# Patient Record
Sex: Female | Born: 1995 | Race: White | Marital: Single | Smoking: Never smoker
Health system: Southern US, Community
[De-identification: ages and names within clinical notes are randomized; demographics above are authoritative.]

## PROBLEM LIST (undated history)

## (undated) DIAGNOSIS — Z973 Presence of spectacles and contact lenses: Secondary | ICD-10-CM

## (undated) DIAGNOSIS — F32A Depression, unspecified: Secondary | ICD-10-CM

## (undated) DIAGNOSIS — F329 Major depressive disorder, single episode, unspecified: Secondary | ICD-10-CM

## (undated) HISTORY — DX: Presence of spectacles and contact lenses: Z97.3

---
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
## 2016-04-15 ENCOUNTER — Other Ambulatory Visit: Payer: Self-pay | Admitting: Family Medicine

## 2016-04-15 ENCOUNTER — Ambulatory Visit
Admission: RE | Admit: 2016-04-15 | Discharge: 2016-04-15 | Disposition: A | Discharge status: Home / Self Care | Payer: 59 | Source: Ambulatory Visit | Attending: Family Medicine | Admitting: Family Medicine

## 2016-04-15 DIAGNOSIS — M419 Scoliosis, unspecified: Secondary | ICD-10-CM

## 2016-10-22 ENCOUNTER — Encounter (HOSPITAL_COMMUNITY): Payer: Self-pay

## 2016-10-22 ENCOUNTER — Emergency Department (HOSPITAL_COMMUNITY)
Admission: EM | Admit: 2016-10-22 | Discharge: 2016-10-23 | Disposition: A | Discharge status: Home / Self Care | Payer: BLUE CROSS/BLUE SHIELD | Attending: Emergency Medicine | Admitting: Emergency Medicine

## 2016-10-22 DIAGNOSIS — F32A Depression, unspecified: Secondary | ICD-10-CM

## 2016-10-22 DIAGNOSIS — Z79899 Other long term (current) drug therapy: Secondary | ICD-10-CM | POA: Diagnosis not present

## 2016-10-22 DIAGNOSIS — R45851 Suicidal ideations: Secondary | ICD-10-CM

## 2016-10-22 DIAGNOSIS — F325 Major depressive disorder, single episode, in full remission: Secondary | ICD-10-CM | POA: Diagnosis present

## 2016-10-22 DIAGNOSIS — F329 Major depressive disorder, single episode, unspecified: Secondary | ICD-10-CM | POA: Insufficient documentation

## 2016-10-22 HISTORY — DX: Depression, unspecified: F32.A

## 2016-10-22 HISTORY — DX: Major depressive disorder, single episode, unspecified: F32.9

## 2016-10-22 LAB — CBG MONITORING, ED: Glucose-Capillary: 77 mg/dL (ref 65–99)

## 2016-10-22 LAB — RAPID URINE DRUG SCREEN, HOSP PERFORMED
Amphetamines: NOT DETECTED
BARBITURATES: NOT DETECTED
BENZODIAZEPINES: NOT DETECTED
COCAINE: NOT DETECTED
Opiates: NOT DETECTED
TETRAHYDROCANNABINOL: NOT DETECTED

## 2016-10-22 LAB — CBC
HEMATOCRIT: 37.9 % (ref 36.0–46.0)
HEMOGLOBIN: 12.6 g/dL (ref 12.0–15.0)
MCH: 28.3 pg (ref 26.0–34.0)
MCHC: 33.2 g/dL (ref 30.0–36.0)
MCV: 85 fL (ref 78.0–100.0)
Platelets: 205 10*3/uL (ref 150–400)
RBC: 4.46 MIL/uL (ref 3.87–5.11)
RDW: 13.2 % (ref 11.5–15.5)
WBC: 6.3 10*3/uL (ref 4.0–10.5)

## 2016-10-22 LAB — ETHANOL: Alcohol, Ethyl (B): <5 mg/dL (ref <5)

## 2016-10-22 LAB — ACETAMINOPHEN LEVEL

## 2016-10-22 LAB — COMPREHENSIVE METABOLIC PANEL
ALBUMIN: 4.5 g/dL (ref 3.5–5.0)
ALK PHOS: 32 U/L — AB (ref 38–126)
ALT: 15 U/L (ref 14–54)
ANION GAP: 13 (ref 5–15)
AST: 21 U/L (ref 15–41)
BUN: 8 mg/dL (ref 6–20)
CALCIUM: 9.5 mg/dL (ref 8.9–10.3)
CO2: 22 mmol/L (ref 22–32)
Chloride: 102 mmol/L (ref 101–111)
Creatinine, Ser: 0.57 mg/dL (ref 0.44–1.00)
GFR calc Af Amer: >60 mL/min (ref >60)
GFR calc non Af Amer: >60 mL/min (ref >60)
GLUCOSE: 112 mg/dL — AB (ref 65–99)
Potassium: 3.5 mmol/L (ref 3.5–5.1)
SODIUM: 137 mmol/L (ref 135–145)
Total Bilirubin: 0.9 mg/dL (ref 0.3–1.2)
Total Protein: 7.3 g/dL (ref 6.5–8.1)

## 2016-10-22 LAB — I-STAT BETA HCG BLOOD, ED (MC, WL, AP ONLY): I-stat hCG, quantitative: <5 m[IU]/mL (ref <5)

## 2016-10-22 LAB — SALICYLATE LEVEL: Salicylate Lvl: <7 mg/dL (ref 2.8–30.0)

## 2016-10-22 NOTE — ED Notes (Signed)
Pt belongings sent home with mother. 

## 2016-10-22 NOTE — ED Triage Notes (Signed)
Pt endorses suicidal thoughts with plan to harm herself by overdosing on drugs(nothing specific). Pt has hx of same. Denies hallucinations or alcohol/drug use. VSS.

## 2016-10-22 NOTE — ED Provider Notes (Signed)
[facility]ENCY DEPT Provider Note   CSN: [id number] Arrival date & time: 10/22/16  4098     History   Chief Complaint Chief Complaint  Patient presents with  . Suicidal    HPI Alison Avery is a 21 y.o. female.  Patient with a history of depression presents with SI, progressively worse over the last couple of days. She reports that she weaned herself off medication last year and is not taking anything currently. No alcohol or substance abuse. She denies self harm prior to arrival. No HI, AVH.    The history is provided by the patient. No language interpreter was used.    Past Medical History:  Diagnosis Date  . Depression   . Wears glasses    for distant vision    Patient Active Problem List   Diagnosis Date Noted  . 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 No pertinent surgical history.  OB History    No data available       Home Medications    Prior to Admission medications   Medication Sig Start Date End Date Taking? Authorizing Provider  buPROPion (WELLBUTRIN XL) 300 MG 24 hr tablet TAKE 1 TABLET BY MOUTH EVERY MORNING 09/17/15   Nelly Rout, MD    Family History Family History  Problem Relation Age of Onset  . ADD / ADHD Neg Hx   . Alcohol abuse Neg Hx   . Anxiety disorder Neg Hx   . Bipolar disorder Neg Hx   . Dementia Neg Hx   . Depression Neg Hx   . Drug abuse Neg Hx   . OCD Neg Hx   . Paranoid behavior Neg Hx   . Physical abuse Neg Hx   . Schizophrenia Neg Hx   . Seizures Neg Hx   . Sexual abuse Neg Hx     Social History Social History  Substance Use Topics  . Smoking status: Never Smoker  . Smokeless tobacco: Never Used  . Alcohol use No     Allergies   Patient has no known allergies.   Review of Systems Review of Systems  Constitutional: Negative for chills and fever.  HENT: Negative.   Respiratory: Negative.   Cardiovascular: Negative.   Gastrointestinal:  Negative.   Musculoskeletal: Negative.   Skin: Negative.   Neurological: Negative.   Psychiatric/Behavioral: Positive for suicidal ideas. Negative for hallucinations and self-injury.     Physical Exam Updated Vital Signs BP 98/80 (BP Location: Left Arm)   Pulse 100   Temp 98.4 F (36.9 C) (Oral)   Resp 18   Ht 5\' 8"  (1.727 m)   Wt 54.4 kg   LMP 09/26/2016 (Exact Date)   SpO2 100%   BMI 18.25 kg/m   Physical Exam  Constitutional: She is oriented to person, place, and time. She appears well-developed and well-nourished.  HENT:  Head: Normocephalic.  Neck: Normal range of motion. Neck supple.  Cardiovascular: Normal rate and regular rhythm.   Pulmonary/Chest: Effort normal and breath sounds normal. She has no wheezes. She has no rales.  Abdominal: Soft. Bowel sounds are normal. There is no tenderness. There is no rebound and no guarding.  Musculoskeletal: Normal range of motion.  Neurological: She is alert and oriented to person, place, and time.  Skin: Skin is warm and dry. No rash noted.  Psychiatric: She has a normal mood and affect.     ED Treatments / Results  Labs (all labs ordered are listed,  but only abnormal results are displayed) Labs Reviewed  COMPREHENSIVE METABOLIC PANEL - Abnormal; Notable for the following:       Result Value   Glucose, Bld 112 (*)    Alkaline Phosphatase 32 (*)    All other components within normal limits  ACETAMINOPHEN LEVEL - Abnormal; Notable for the following:    Acetaminophen (Tylenol), Serum <10 (*)    All other components within normal limits  ETHANOL  SALICYLATE LEVEL  CBC  RAPID URINE DRUG SCREEN, HOSP PERFORMED  I-STAT BETA HCG BLOOD, ED ([facility], [facility], [facility] ONLY)  CBG MONITORING, ED   Results for orders placed or performed during the hospital encounter of 10/22/16  Comprehensive metabolic panel  Result Value Ref Range   Sodium 137 135 - 145 mmol/L   Potassium 3.5 3.5 - 5.1 mmol/L   Chloride 102 101 - 111 mmol/L   CO2 22 22  - 32 mmol/L   Glucose, Bld 112 (H) 65 - 99 mg/dL   BUN 8 6 - 20 mg/dL   Creatinine, Ser 2.95 0.44 - 1.00 mg/dL   Calcium 9.5 8.9 - 62.1 mg/dL   Total Protein 7.3 6.5 - 8.1 g/dL   Albumin 4.5 3.5 - 5.0 g/dL   AST 21 15 - 41 U/L   ALT 15 14 - 54 U/L   Alkaline Phosphatase 32 (L) 38 - 126 U/L   Total Bilirubin 0.9 0.3 - 1.2 mg/dL   GFR calc non Af Amer >60 >60 mL/min   GFR calc Af Amer >60 >60 mL/min   Anion gap 13 5 - 15  Ethanol  Result Value Ref Range   Alcohol, Ethyl (B) <5 <5 mg/dL  Salicylate level  Result Value Ref Range   Salicylate Lvl <7.0 2.8 - 30.0 mg/dL  Acetaminophen level  Result Value Ref Range   Acetaminophen (Tylenol), Serum <10 (L) 10 - 30 ug/mL  cbc  Result Value Ref Range   WBC 6.3 4.0 - 10.5 K/uL   RBC 4.46 3.87 - 5.11 MIL/uL   Hemoglobin 12.6 12.0 - 15.0 g/dL   HCT 30.8 65.7 - 84.6 %   MCV 85.0 78.0 - 100.0 fL   MCH 28.3 26.0 - 34.0 pg   MCHC 33.2 30.0 - 36.0 g/dL   RDW 96.2 95.2 - 84.1 %   Platelets 205 150 - 400 K/uL  Rapid urine drug screen (hospital performed)  Result Value Ref Range   Opiates NONE DETECTED NONE DETECTED   Cocaine NONE DETECTED NONE DETECTED   Benzodiazepines NONE DETECTED NONE DETECTED   Amphetamines NONE DETECTED NONE DETECTED   Tetrahydrocannabinol NONE DETECTED NONE DETECTED   Barbiturates NONE DETECTED NONE DETECTED  I-Stat beta hCG blood, ED  Result Value Ref Range   I-stat hCG, quantitative <5.0 <5 mIU/mL   Comment 3            EKG  EKG Interpretation None       Radiology No results found.  Procedures Procedures (including critical care time)  Medications Ordered in ED Medications - No data to display   Initial Impression / Assessment and Plan / ED Course  I have reviewed the triage vital signs and the nursing notes.  Pertinent labs & imaging results that were available during my care of the patient were reviewed by me and considered in my medical decision making (see chart for details).      Patient with history of depression and previous suicide attempt by overdose presents with increasing SI over time, worse in  the last 2-3 days. Plan is to overdose. TTS consultation ordered to determine disposition.   The patient is a vegetarian. Dietary orders adjusted to meet her needs.   Final Clinical Impressions(s) / ED Diagnoses   Final diagnoses:  None   1. SI New Prescriptions New Prescriptions   No medications on file     Danne HarborShari Theodore Virgin, PA-C 10/22/16 2141    Linwood DibblesJon Knapp, MD 10/24/16 [phone]

## 2016-10-23 MED ORDER — ACETAMINOPHEN 325 MG PO TABS
650.0000 mg | ORAL_TABLET | Freq: Once | ORAL | Status: AC
  Administered 2016-10-23: 650 mg via ORAL
  Filled 2016-10-23: qty 2

## 2016-10-23 MED ORDER — BUPROPION HCL ER (XL) 300 MG PO TB24: 300.0000 mg | ORAL_TABLET | Freq: Every day | ORAL | 0 refills | Status: AC

## 2016-10-23 NOTE — ED Notes (Signed)
Patient's mother updated and went home for the evening.

## 2016-10-23 NOTE — ED Notes (Signed)
TTS device at bedside. Assessment underway.  

## 2016-10-23 NOTE — ED Notes (Signed)
Regular Diet was ordered for Lunch. 
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
## 2016-10-23 NOTE — Consult Note (Signed)
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 female patient admitted with suicidal ideations that became overwhelming. Pt is a Ship broker at [employer] and stated she was feeling very suicidal so she called her mother to come and pick her up.  HPI:  Per tele assessment note on chart written by ,[facility] Counselor on 10-22-16: Alison Avery is an 21 y.o. female.  -Clinician reviewed note by Edwena Bunde, PA.  Patient with a history of depression presents with SI, progressively worse over the last couple of days. She reports that she weaned herself off medication last year and is not taking anything currently. No alcohol or substance abuse. She denies self harm prior to arrival. No HI, AVH.  Patient said that she is starting to feel the way she did before she tried to overdose in her junior year of high school.  Patient said that in junior year of high school she did try to overdose but had never told anyone so she did not get any help until a little later.  Then it was outpatient care.    Patient said that over the last few days she has been more depressed and having some SI. She said that last night (Wednesday night) she talked to her mother and broke down crying.  Her mother came to [place] and brought her home.  Patient says that she does not now feel like she wants to kill herself.  She says that she feels that she can maintain safety.    Patient said that she used to take Welbutrin but stopped about a year ago.  She had been seen by [facility] outpatient for a long time but stopped when she  started college at [employer].  She looked into going to counseling center there but has not made any appointments.  Patient denies use of any ETOH or illicit drugs.  No previous inpatient psychiatric care.    -Clinician discussed patient care with Lindon Romp, FNP who recommends an AM psych eval for final disposition.  Patient care discussed with Edwena Bunde, PA who was in agreement with patient being seen by psychiatry in AM.  Diagnosis: MDD recurrent moderate  Today during tele psych consult:  Pt was seen and chart reviewed. Pt stated she has been feeling suicidal and having suicidal thoughts for the past few days. She stated she is feeling better today and is not having suicidal thoughts. Pt denied homicidal ideation, auditory and visual hallucinations, and does not appear to be responding to internal stimuli. Pt stated she is in college at [employer] and called her mother to come and get her because she was feeling close to acting on her suicidal ideations. Pt stated she was on Wellbutrin but weaned herself off a year ago. Pt is requesting a prescription so she can get back on her medications and return to her normal. Pt stated she would seek counseling through [employer]'s counseling center and would call her parents if the thoughts return and become overwhelming.   Discussed case with Dr Dwyane Dee who recommends that pt be discharged home with a precrisption for Wellbutrin XL 337m  PO QHS.   Past Psychiatric History:   Risk to Self: Suicidal Ideation: No-Not Currently/Within Last 6 Months Suicidal Intent: No Is patient at risk for suicide?: Yes Suicidal Plan?: No Access to Means: Yes Specify Access to Suicidal Means: None What has been your use of drugs/alcohol within the last 12 months?: None How many times?: 1 Other Self Harm Risks: None now Triggers for Past Attempts: Unpredictable Intentional Self Injurious Behavior: Cutting Comment - Self Injurious Behavior: Hx of cutting in high school Risk to  Others: Homicidal Ideation: No Thoughts of Harm to Others: No Current Homicidal Intent: No Current Homicidal Plan: No Access to Homicidal Means: No Identified Victim: No one History of harm to others?: No Assessment of Violence: None Noted Violent Behavior Description: None Does patient have access to weapons?: No Criminal Charges Pending?: No Does patient have a court date: No Prior Inpatient Therapy: Prior Inpatient Therapy: No Prior Therapy Dates: N/A Prior Therapy Facilty/Provider(s): N//A Reason for Treatment: N/A Prior Outpatient Therapy: Prior Outpatient Therapy: Yes Prior Therapy Dates: '15-'16 Prior Therapy Facilty/Provider(s): [facility] outpatient Reason for Treatment: med management Does patient have an ACCT team?: No Does patient have Intensive In-House Services?  : No Does patient have [facility] services? : No Does patient have P4CC services?: No  Past Medical History:  Past Medical History:  Diagnosis Date  . Depression   . Wears glasses    for distant vision   History reviewed. No pertinent surgical history. Family History:  Family History  Problem Relation Age of Onset  . ADD / ADHD Neg Hx   . Alcohol abuse Neg Hx   . Anxiety disorder Neg Hx   . Bipolar disorder Neg Hx   . Dementia Neg Hx   . Depression Neg Hx   . Drug abuse Neg Hx   . OCD Neg Hx   . Paranoid behavior Neg Hx   . Physical abuse Neg Hx   . Schizophrenia Neg Hx   . Seizures Neg Hx   . Sexual abuse Neg Hx    Family Psychiatric  History: Unknown Social History:  History  Alcohol Use No     History  Drug Use No    Social History   Social History  . Marital status: Single    Spouse name: N/A  . Number of children: N/A  . Years of education: N/A   Social History Main Topics  . Smoking status: Never Smoker  . Smokeless tobacco: Never Used  . Alcohol use No  . Drug use: No  . Sexual activity: No   Other Topics Concern  . None   Social History Narrative  . None   Additional  Social History:    Allergies:  No Known Allergies  Labs:  Results for orders placed or performed during the hospital encounter of 10/22/16 (from the past 48 hour(s))  Comprehensive metabolic panel     Status: Abnormal   Collection Time: 10/22/16  7:34 PM  Result Value Ref Range   Sodium 137 135 - 145 mmol/L   Potassium 3.5 3.5 - 5.1 mmol/L   Chloride 102 101 - 111 mmol/L   CO2 22 22 - 32 mmol/L   Glucose, Bld 112 (H) 65 - 99 mg/dL   BUN 8 6 - 20 mg/dL   Creatinine, Ser 0.57 0.44 - 1.00 mg/dL   Calcium 9.5 8.9 - 10.3 mg/dL   Total Protein 7.3 6.5 - 8.1 g/dL   Albumin 4.5 3.5 - 5.0 g/dL   AST 21 15 -  41 U/L   ALT 15 14 - 54 U/L   Alkaline Phosphatase 32 (L) 38 - 126 U/L   Total Bilirubin 0.9 0.3 - 1.2 mg/dL   GFR calc non Af Amer >60 >60 mL/min   GFR calc Af Amer >60 >60 mL/min    Comment: (NOTE) The eGFR has been calculated using the CKD EPI equation. This calculation has not been validated in all clinical situations. eGFR's persistently <60 mL/min signify possible Chronic Kidney Disease.    Anion gap 13 5 - 15  Ethanol     Status: None   Collection Time: 10/22/16  7:34 PM  Result Value Ref Range   Alcohol, Ethyl (B) <5 <5 mg/dL    Comment:        LOWEST DETECTABLE LIMIT FOR SERUM ALCOHOL IS 5 mg/dL FOR MEDICAL PURPOSES ONLY   Salicylate level     Status: None   Collection Time: 10/22/16  7:34 PM  Result Value Ref Range   Salicylate Lvl <8.5 2.8 - 30.0 mg/dL  Acetaminophen level     Status: Abnormal   Collection Time: 10/22/16  7:34 PM  Result Value Ref Range   Acetaminophen (Tylenol), Serum <10 (L) 10 - 30 ug/mL    Comment:        THERAPEUTIC CONCENTRATIONS VARY SIGNIFICANTLY. A RANGE OF 10-30 ug/mL MAY BE AN EFFECTIVE CONCENTRATION FOR MANY PATIENTS. HOWEVER, SOME ARE BEST TREATED AT CONCENTRATIONS OUTSIDE THIS RANGE. ACETAMINOPHEN CONCENTRATIONS >150 ug/mL AT 4 HOURS AFTER INGESTION AND >50 ug/mL AT 12 HOURS AFTER INGESTION ARE OFTEN ASSOCIATED WITH  TOXIC REACTIONS.   cbc     Status: None   Collection Time: 10/22/16  7:34 PM  Result Value Ref Range   WBC 6.3 4.0 - 10.5 K/uL   RBC 4.46 3.87 - 5.11 MIL/uL   Hemoglobin 12.6 12.0 - 15.0 g/dL   HCT 37.9 36.0 - 46.0 %   MCV 85.0 78.0 - 100.0 fL   MCH 28.3 26.0 - 34.0 pg   MCHC 33.2 30.0 - 36.0 g/dL   RDW 13.2 11.5 - 15.5 %   Platelets 205 150 - 400 K/uL  Rapid urine drug screen (hospital performed)     Status: None   Collection Time: 10/22/16  7:36 PM  Result Value Ref Range   Opiates NONE DETECTED NONE DETECTED   Cocaine NONE DETECTED NONE DETECTED   Benzodiazepines NONE DETECTED NONE DETECTED   Amphetamines NONE DETECTED NONE DETECTED   Tetrahydrocannabinol NONE DETECTED NONE DETECTED   Barbiturates NONE DETECTED NONE DETECTED    Comment:        DRUG SCREEN FOR MEDICAL PURPOSES ONLY.  IF CONFIRMATION IS NEEDED FOR ANY PURPOSE, NOTIFY LAB WITHIN 5 DAYS.        LOWEST DETECTABLE LIMITS FOR URINE DRUG SCREEN Drug Class       Cutoff (ng/mL) Amphetamine      1000 Barbiturate      200 Benzodiazepine   631 Tricyclics       497 Opiates          300 Cocaine          300 THC              50   I-Stat beta hCG blood, ED     Status: None   Collection Time: 10/22/16  7:54 PM  Result Value Ref Range   I-stat hCG, quantitative <5.0 <5 mIU/mL   Comment 3            Comment:  GEST. AGE      CONC.  (mIU/mL)   <=1 WEEK        5 - 50     2 WEEKS       50 - 500     3 WEEKS       100 - 10,000     4 WEEKS     1,000 - 30,000        FEMALE AND NON-PREGNANT FEMALE:     LESS THAN 5 mIU/mL   CBG monitoring, ED     Status: None   Collection Time: 10/22/16  9:42 PM  Result Value Ref Range   Glucose-Capillary 77 65 - 99 mg/dL   Comment 1 Notify RN    Comment 2 Document in Chart     Current Facility-Administered Medications  Medication Dose Route Frequency Provider Last Rate Last Dose  . acetaminophen (TYLENOL) tablet 650 mg  650 mg Oral Once Sherwood Gambler, MD       No current  outpatient prescriptions on file.    Musculoskeletal: Unable to assess: camera  Psychiatric Specialty Exam: Physical Exam  Review of Systems  Psychiatric/Behavioral: Positive for depression and suicidal ideas. Negative for hallucinations, memory loss and substance abuse. The patient is not nervous/anxious and does not have insomnia.   All other systems reviewed and are negative.   Blood pressure 111/60, pulse 92, temperature 97.6 F (36.4 C), temperature source Oral, resp. rate 16, height '5\' 8"'  (1.727 m), weight 54.4 kg (120 lb), last menstrual period 09/26/2016, SpO2 100 %.Body mass index is 18.25 kg/m.  General Appearance: Casual and Fairly Groomed  Eye Contact:  Good  Speech:  Clear and Coherent and Normal Rate  Volume:  Normal  Mood:  Anxious and Depressed  Affect:  Congruent and Depressed  Thought Process:  Coherent, Goal Directed and Linear  Orientation:  Full (Time, Place, and Person)  Thought Content:  WDL and Logical  Suicidal Thoughts:  Yes.  with intent/plan  Homicidal Thoughts:  No  Memory:  Immediate;   Good Recent;   Good Remote;   Fair  Judgement:  Good  Insight:  Good  Psychomotor Activity:  Normal  Concentration:  Concentration: Good and Attention Span: Good  Recall:  Good  Fund of Knowledge:  Good  Language:  Good  Akathisia:  No  Handed:  Right  AIMS (if indicated):     Assets:  Communication Skills Desire for Improvement Financial Resources/Insurance Housing Leisure Time Physical Health Resilience Social Support Transportation Vocational/Educational  ADL's:  Intact  Cognition:  WNL  Sleep:        Treatment Plan Summary: Discharge Home  Provide pt with a prescription for Wellbutrin XL 300 mg PO QHS for Major Depressive Disorder Activity as tolerated Stay well hydrated Eat a balanced diet Get plenty of rest [facility]   Disposition: No evidence of imminent risk to self or others at  present.   Patient does not meet criteria for psychiatric inpatient admission. Supportive therapy provided about ongoing stressors. Discussed crisis plan, support from social network, calling 911, coming to the Emergency Department, and calling Suicide Hotline.  Ethelene Hal, NP 10/23/2016 1:03 PM

## 2016-10-23 NOTE — ED Notes (Signed)
Pt in bed eating breakfast tray. Denies needs.

## 2016-10-23 NOTE — ED Notes (Signed)
Patient was given a snack and drink, and a regular diet taken.

## 2016-10-23 NOTE — ED Notes (Signed)
Family at bedside. 

## 2016-10-23 NOTE — ED Notes (Signed)
Patient ate 100 % of her meal.

## 2016-10-23 NOTE — ED Provider Notes (Signed)
Psych has re-evaluated, no longer suicidal. Recommend wellbutrin 300 mg qhs (used to be on this). Patient has had on and off depression, denies SI to me. Appears stable. D/c home   Pricilla LovelessScott Wilda Wetherell, MD 10/23/16 [phone]

## 2016-10-23 NOTE — ED Notes (Signed)
Attempted to call for report on patient.  Patient still on TTS.  Will follow up

## 2016-10-23 NOTE — ED Notes (Signed)
Mother at bedside.

## 2017-03-11 IMAGING — CR DG SCOLIOSIS EVAL COMPLETE SPINE 1V
1 series · 3 of 3 positions shown · non-contrast
Comparison: None.

CLINICAL DATA: Scoliosis

EXAM:
DG SCOLIOSIS EVAL COMPLETE SPINE 1V

[Series 1001: view not recorded · 0.40mm/px · 3 of 3 slices shown]
[im 1/3]
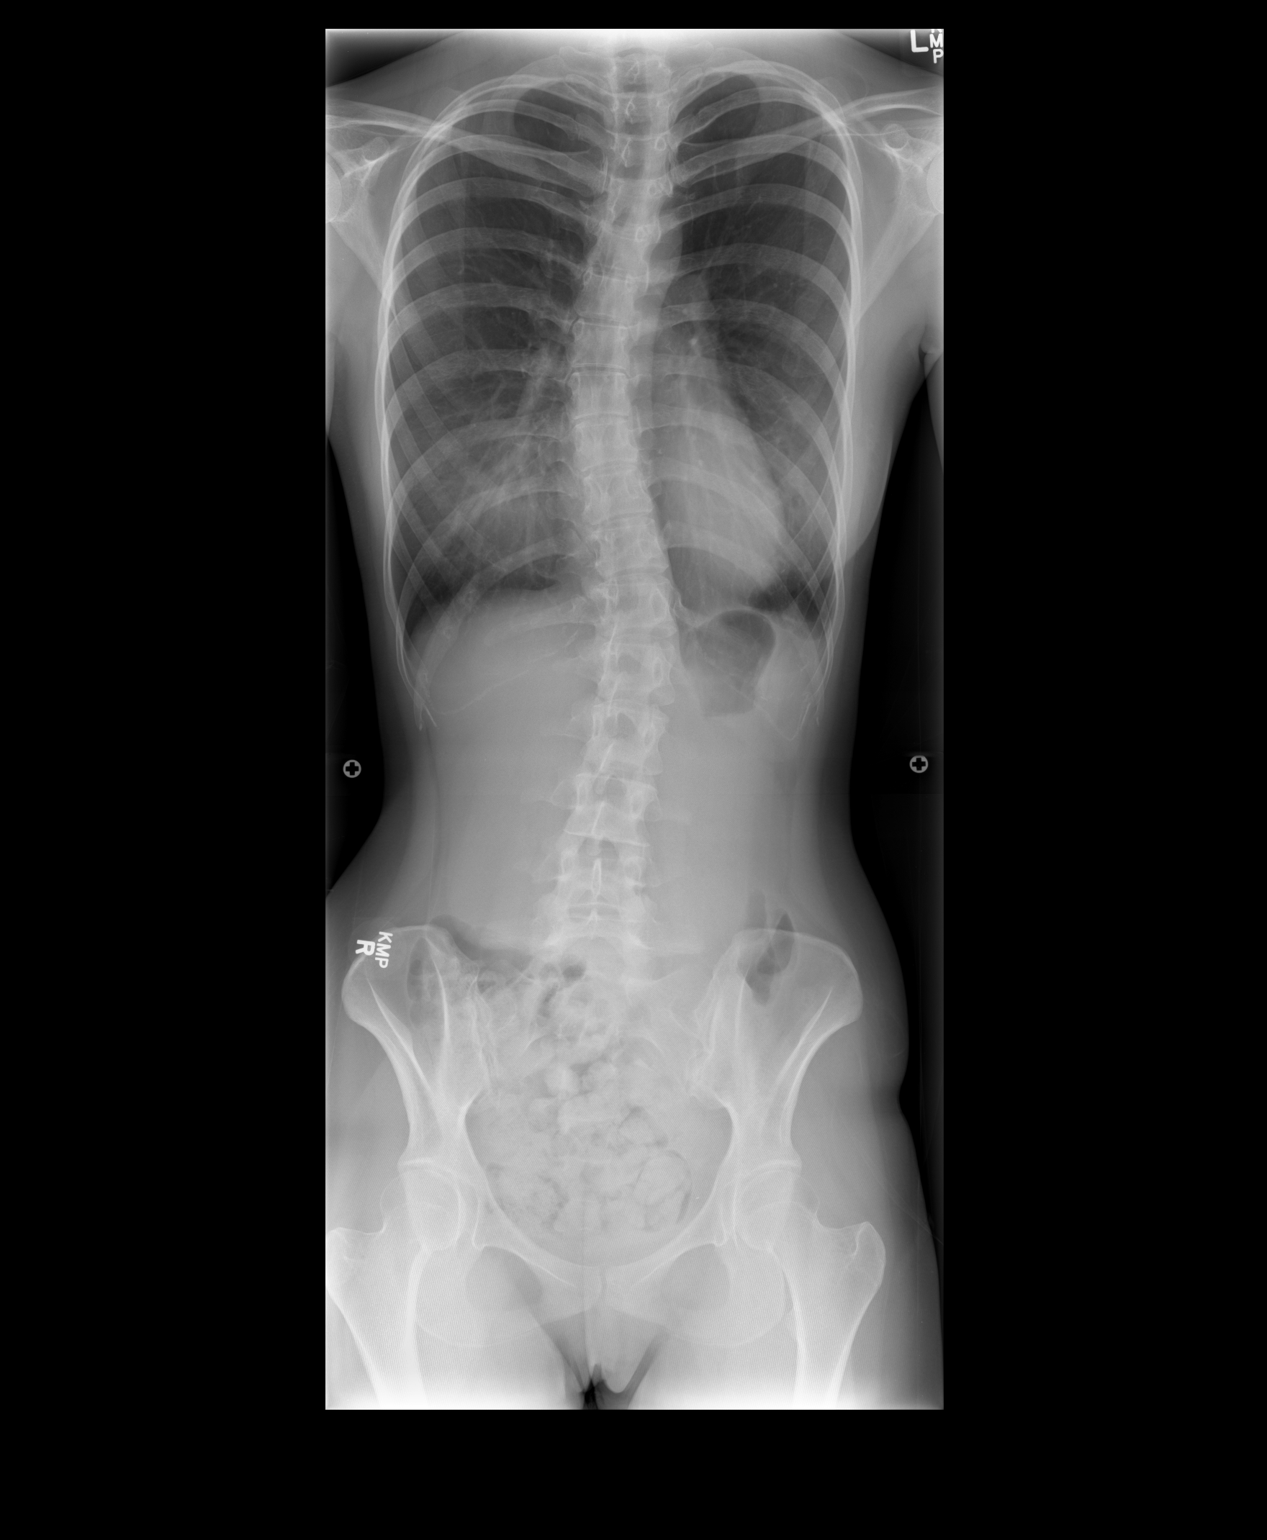
[im 2/3]
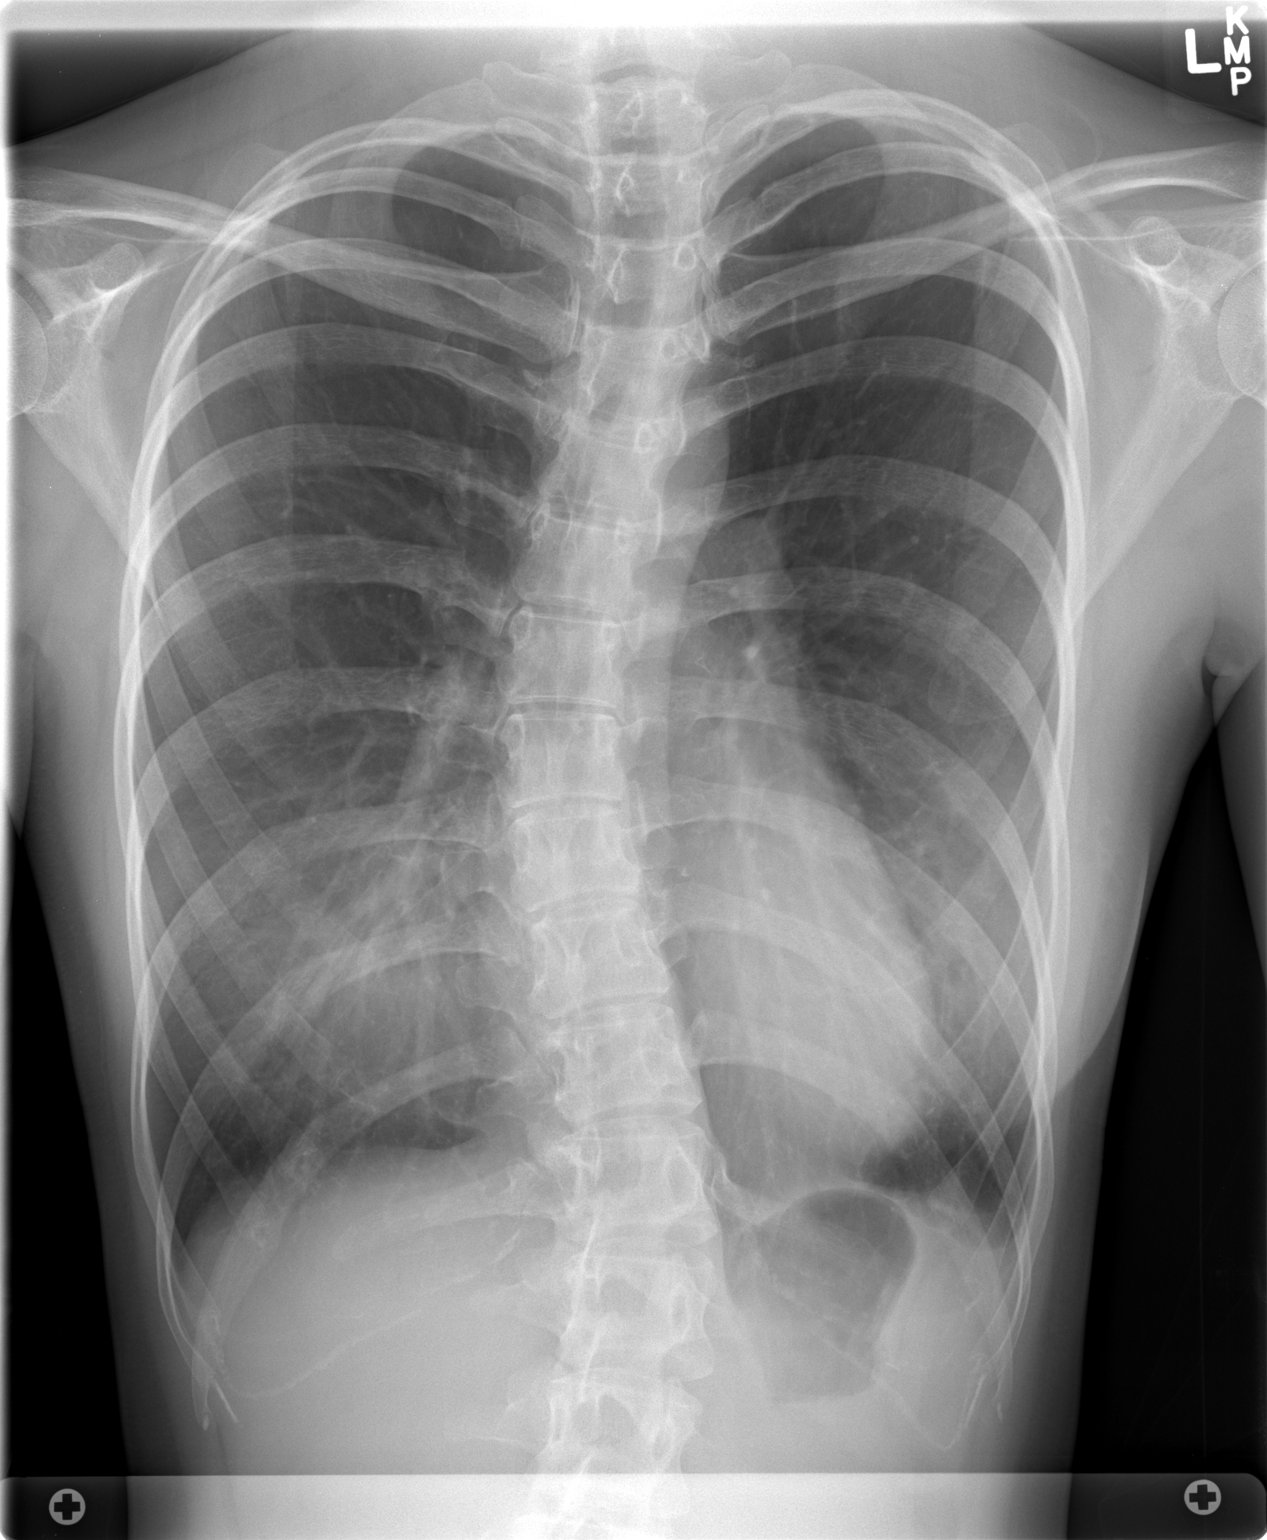
[im 3/3]
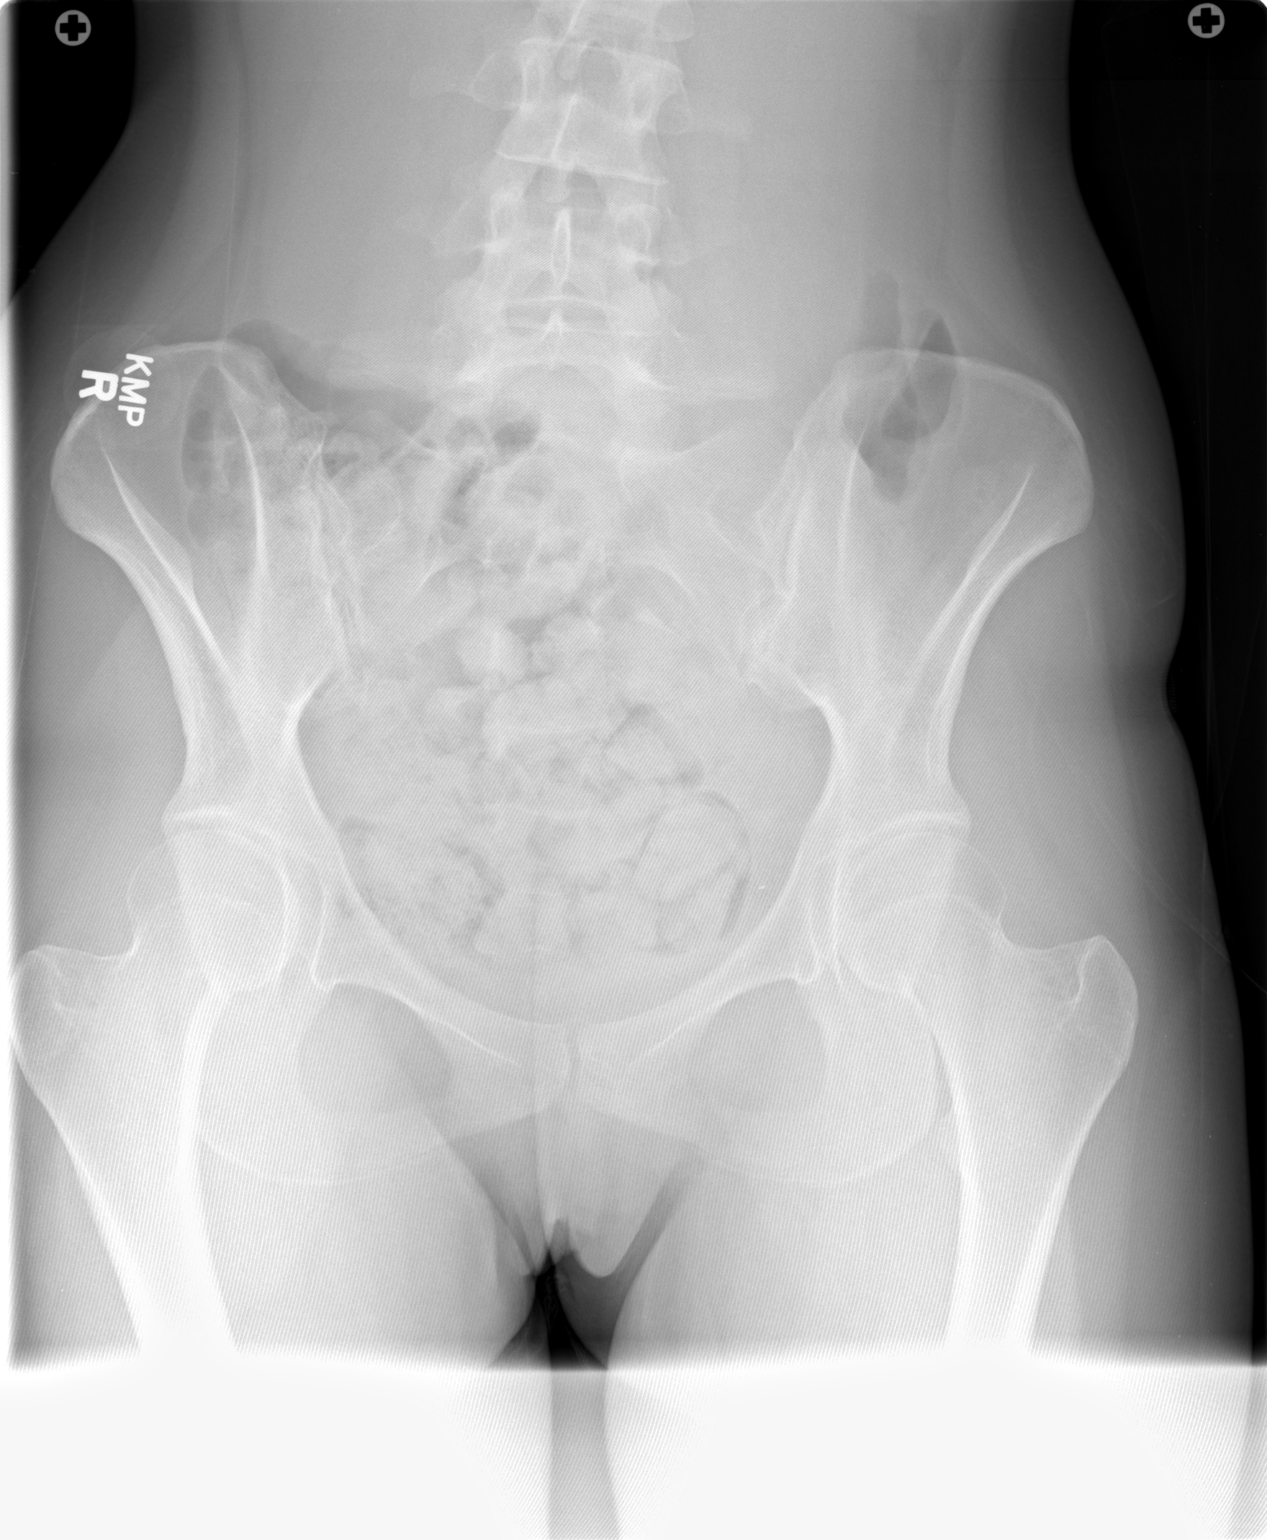

[3 of 3 positions shown; findings below may reference images not displayed]

FINDINGS: 12 pairs of ribs.

Five non-rib-bearing lumbar vertebra.

Biconvex thoracolumbar scoliosis.

Dextroconvex thoracic scoliosis measured at 24 degrees from inferior
endplate of T5 through inferior endplate of T10.

Levoconvex thoracolumbar scoliosis measured at 23 degrees from
inferior endplate of T10 through inferior endplate of L2.

No vertebral anomalies identified.
IMPRESSION: Biconvex thoracolumbar scoliosis as above.

## 2017-04-19 ENCOUNTER — Other Ambulatory Visit: Payer: Self-pay | Admitting: Family Medicine

## 2017-04-19 ENCOUNTER — Ambulatory Visit
Admission: RE | Admit: 2017-04-19 | Discharge: 2017-04-19 | Disposition: A | Discharge status: Home / Self Care | Payer: BLUE CROSS/BLUE SHIELD | Source: Ambulatory Visit | Attending: Family Medicine | Admitting: Family Medicine

## 2017-04-19 DIAGNOSIS — Z23 Encounter for immunization: Secondary | ICD-10-CM | POA: Diagnosis not present

## 2017-04-19 DIAGNOSIS — Z09 Encounter for follow-up examination after completed treatment for conditions other than malignant neoplasm: Secondary | ICD-10-CM

## 2017-04-19 DIAGNOSIS — M545 Low back pain: Secondary | ICD-10-CM | POA: Diagnosis not present

## 2017-04-19 DIAGNOSIS — Z0000 Encounter for general adult medical examination without abnormal findings: Secondary | ICD-10-CM | POA: Diagnosis not present

## 2017-06-15 DIAGNOSIS — Z23 Encounter for immunization: Secondary | ICD-10-CM | POA: Diagnosis not present

## 2017-12-03 DIAGNOSIS — Z111 Encounter for screening for respiratory tuberculosis: Secondary | ICD-10-CM | POA: Diagnosis not present

## 2018-03-15 IMAGING — DX DG SCOLIOSIS EVAL COMPLETE SPINE 1V
1 series · 1 of 1 positions shown · non-contrast
Comparison: 04/15/2016

CLINICAL DATA: Follow-up scoliosis, back and hip pain

EXAM:
DG SCOLIOSIS EVAL COMPLETE SPINE 1V

[dg scoliosis ap]
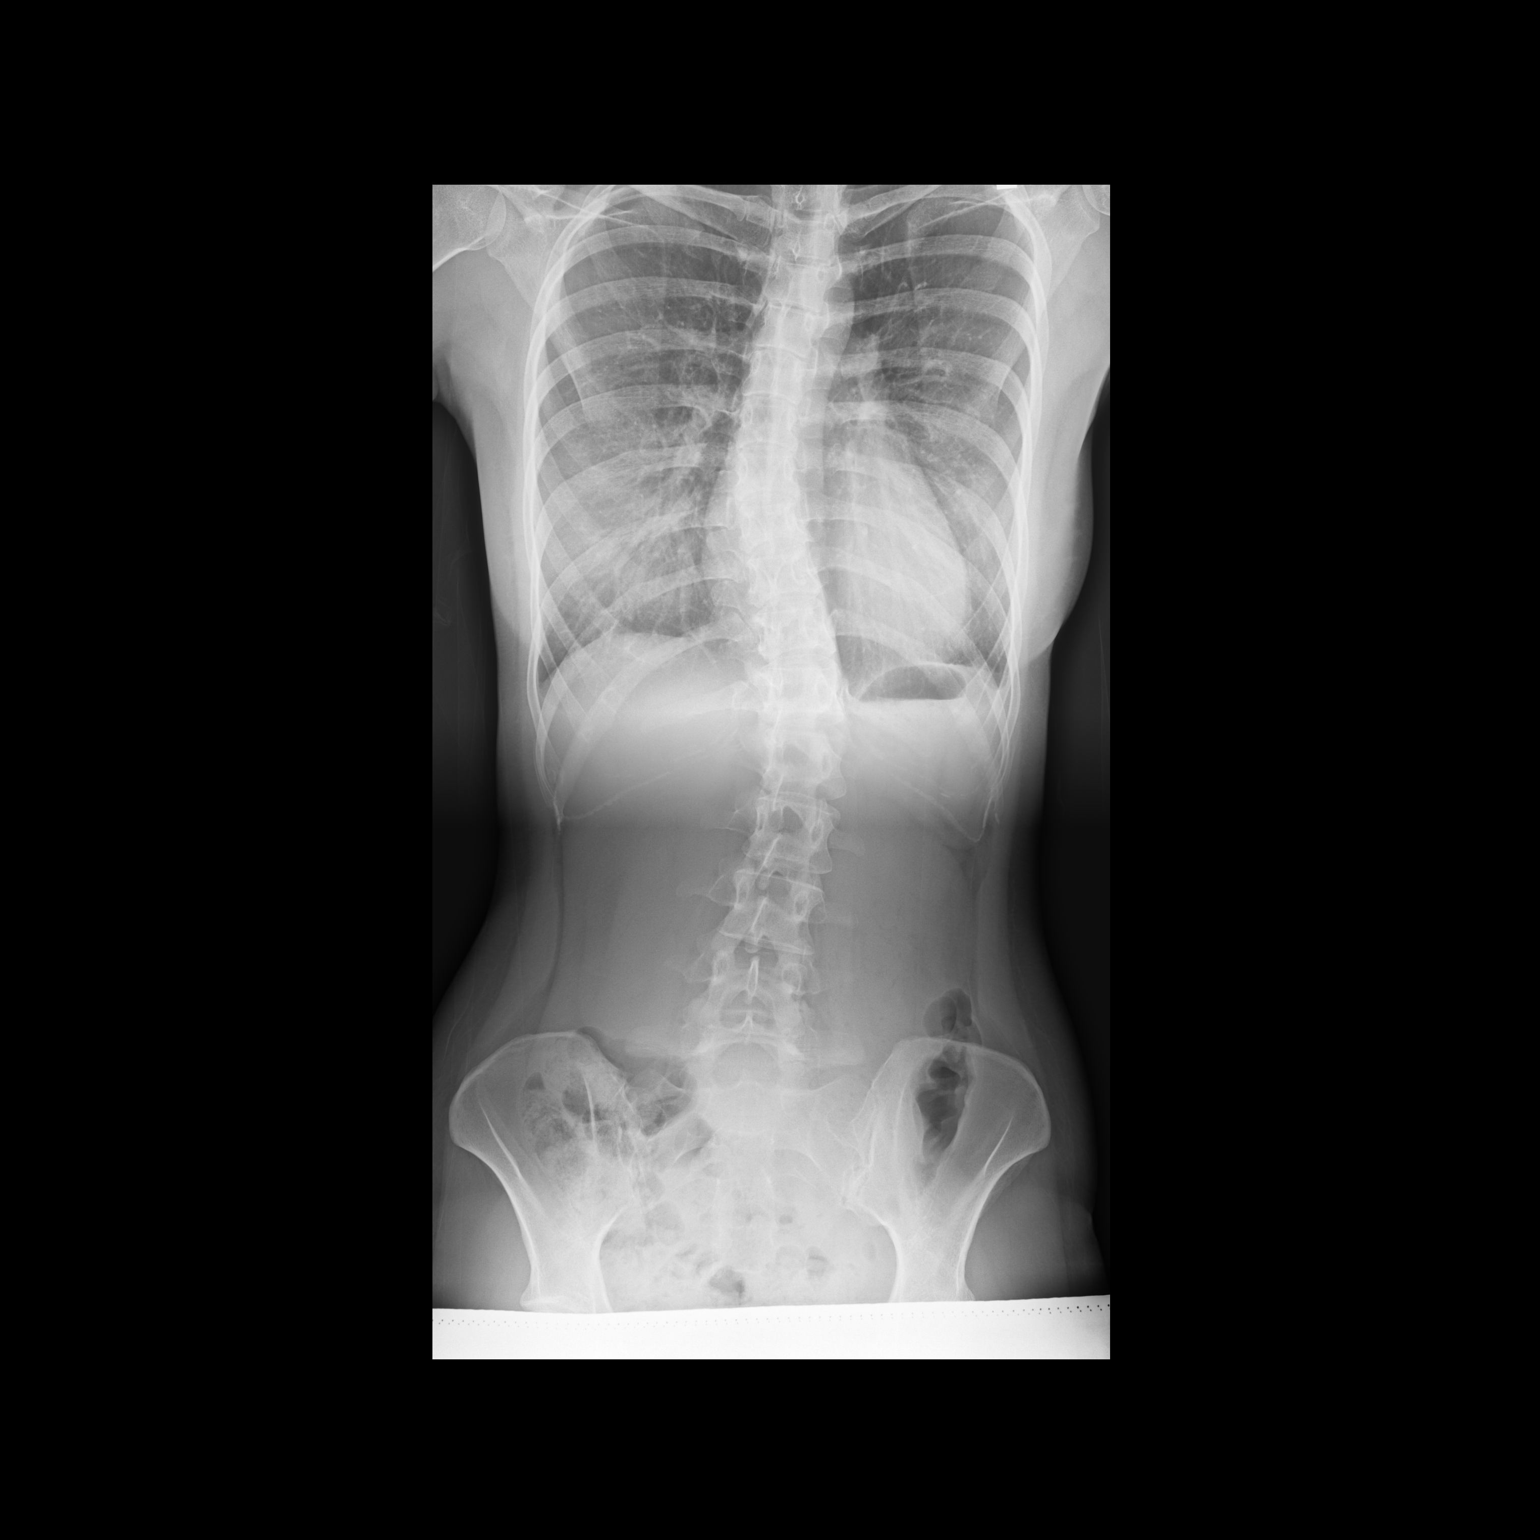

[1 of 1 positions shown; findings below may reference images not displayed]

FINDINGS: Twelve pairs of ribs.

5 non-rib-bearing lumbar vertebra.

Biconvex thoracolumbar scoliosis.

Dextroconvex thoracic scoliosis component measured at 22 degrees
from inferior T5 through inferior T10.

Levoconvex thoracolumbar scoliosis component measured at 21 degrees
from inferior T10 through inferior L2.

No fracture or vertebral anomaly identified.
IMPRESSION: Thoracolumbar scoliosis as above, minimally decreased from the
previous study.

## 2018-05-11 DIAGNOSIS — Z23 Encounter for immunization: Secondary | ICD-10-CM | POA: Diagnosis not present

## 2018-05-28 DIAGNOSIS — Z23 Encounter for immunization: Secondary | ICD-10-CM | POA: Diagnosis not present

## 2019-05-29 DIAGNOSIS — Z20828 Contact with and (suspected) exposure to other viral communicable diseases: Secondary | ICD-10-CM | POA: Diagnosis not present
